# Patient Record
Sex: Male | Born: 1965 | Race: White | Hispanic: No | Marital: Married
Health system: Southern US, Community
[De-identification: ages and names within clinical notes are randomized; demographics above are authoritative.]

## PROBLEM LIST (undated history)

## (undated) DIAGNOSIS — I10 Essential (primary) hypertension: Secondary | ICD-10-CM

---

## 2018-03-21 ENCOUNTER — Encounter (HOSPITAL_COMMUNITY): Payer: Self-pay | Admitting: Pharmacy Technician

## 2018-03-21 ENCOUNTER — Other Ambulatory Visit: Payer: Self-pay

## 2018-03-21 ENCOUNTER — Emergency Department (HOSPITAL_COMMUNITY): Payer: No Typology Code available for payment source

## 2018-03-21 ENCOUNTER — Emergency Department (HOSPITAL_COMMUNITY)
Admission: EM | Admit: 2018-03-21 | Discharge: 2018-03-21 | Disposition: A | Discharge status: Home / Self Care | Payer: No Typology Code available for payment source | Attending: Emergency Medicine | Admitting: Emergency Medicine

## 2018-03-21 DIAGNOSIS — I10 Essential (primary) hypertension: Secondary | ICD-10-CM | POA: Insufficient documentation

## 2018-03-21 DIAGNOSIS — R072 Precordial pain: Secondary | ICD-10-CM | POA: Insufficient documentation

## 2018-03-21 DIAGNOSIS — R079 Chest pain, unspecified: Secondary | ICD-10-CM | POA: Diagnosis present

## 2018-03-21 HISTORY — DX: Essential (primary) hypertension: I10

## 2018-03-21 LAB — CBC WITH DIFFERENTIAL/PLATELET
ABS IMMATURE GRANULOCYTES: 0.03 10*3/uL (ref 0.00–0.07)
Basophils Absolute: 0.1 10*3/uL (ref 0.0–0.1)
Basophils Relative: 1 %
EOS ABS: 0 10*3/uL (ref 0.0–0.5)
Eosinophils Relative: 1 %
HCT: 46.8 % (ref 39.0–52.0)
Hemoglobin: 15.1 g/dL (ref 13.0–17.0)
IMMATURE GRANULOCYTES: 1 %
Lymphocytes Relative: 35 %
Lymphs Abs: 1.7 10*3/uL (ref 0.7–4.0)
MCH: 27.8 pg (ref 26.0–34.0)
MCHC: 32.3 g/dL (ref 30.0–36.0)
MCV: 86 fL (ref 80.0–100.0)
MONOS PCT: 10 %
Monocytes Absolute: 0.5 10*3/uL (ref 0.1–1.0)
NEUTROS ABS: 2.7 10*3/uL (ref 1.7–7.7)
NEUTROS PCT: 52 %
NRBC: 0 % (ref 0.0–0.2)
PLATELETS: 255 10*3/uL (ref 150–400)
RBC: 5.44 MIL/uL (ref 4.22–5.81)
RDW: 13.7 % (ref 11.5–15.5)
WBC: 5 10*3/uL (ref 4.0–10.5)

## 2018-03-21 LAB — COMPREHENSIVE METABOLIC PANEL
ALT: 57 U/L — ABNORMAL HIGH (ref 0–44)
ANION GAP: 12 (ref 5–15)
AST: 42 U/L — ABNORMAL HIGH (ref 15–41)
Albumin: 4.8 g/dL (ref 3.5–5.0)
Alkaline Phosphatase: 45 U/L (ref 38–126)
BILIRUBIN TOTAL: 0.6 mg/dL (ref 0.3–1.2)
BUN: 9 mg/dL (ref 6–20)
CHLORIDE: 99 mmol/L (ref 98–111)
CO2: 25 mmol/L (ref 22–32)
Calcium: 10.6 mg/dL — ABNORMAL HIGH (ref 8.9–10.3)
Creatinine, Ser: 0.88 mg/dL (ref 0.61–1.24)
Glucose, Bld: 105 mg/dL — ABNORMAL HIGH (ref 70–99)
POTASSIUM: 3.7 mmol/L (ref 3.5–5.1)
Sodium: 136 mmol/L (ref 135–145)
TOTAL PROTEIN: 7.8 g/dL (ref 6.5–8.1)

## 2018-03-21 LAB — I-STAT TROPONIN, ED
TROPONIN I, POC: 0.01 ng/mL (ref 0.00–0.08)
TROPONIN I, POC: 0 ng/mL (ref 0.00–0.08)

## 2018-03-21 LAB — LIPASE, BLOOD: LIPASE: 41 U/L (ref 11–51)

## 2018-03-21 MED ORDER — ASPIRIN 81 MG PO CHEW: 81.0000 mg | CHEWABLE_TABLET | Freq: Every day | ORAL | 0 refills | Status: AC

## 2018-03-21 MED ORDER — OMEPRAZOLE 20 MG PO CPDR: 20.0000 mg | DELAYED_RELEASE_CAPSULE | Freq: Every day | ORAL | 0 refills | Status: AC

## 2018-03-21 MED ORDER — ASPIRIN 81 MG PO CHEW
324.0000 mg | CHEWABLE_TABLET | Freq: Once | ORAL | Status: AC
  Administered 2018-03-21: 324 mg via ORAL
  Filled 2018-03-21: qty 4

## 2018-03-21 NOTE — ED Triage Notes (Signed)
Pt arrives pov with reports of CP onset 0800 today. Hx indigestion. Pt in NAD.

## 2018-03-21 NOTE — ED Provider Notes (Signed)
[facility] EMERGENCY DEPARTMENT Provider Note   CSN: [id number] Arrival date & time: 03/21/18  1216     History   Chief Complaint Chief Complaint  Patient presents with  . Chest Pain    HPI Reginald Floyd is a 52 y.o. male.  HPI Patient reports that this morning he was at work and he was just doing light activity.  No physical exertion.  He reports he started to get a pain in the left side of his chest.  He reports that kind of moved up towards the top of his chest and towards the bottom of his ribs.  It was aching in quality.  He reports that he did not have any associated shortness of breath, nausea, diaphoresis.  He reports he thought it was indigestion but it did kind of persist.  He went on to take a couple of Tums.  Ultimately the pain did abate and resolved.  After having had that episode, he moved furniture.  He reports that during the exertion of moving furniture, he did not have any recurrence of pain, shortness of breath or any other symptoms.  Patient has no cardiac history.  He is a non-smoker.  Family history negative for early coronary artery disease.  He has multiple siblings without early CAD.  Father developed an MI in his 54s. Past Medical History:  Diagnosis Date  . Hypertension     There are no active problems to display for this patient.   History reviewed. No pertinent surgical history.      Home Medications    Prior to Admission medications   Medication Sig Start Date End Date Taking? Authorizing Provider  aspirin 81 MG chewable tablet Chew 1 tablet (81 mg total) by mouth daily. 03/21/18   Arby Barrette, MD  omeprazole (PRILOSEC) 20 MG capsule Take 1 capsule (20 mg total) by mouth daily. 03/21/18   Arby Barrette, MD    Family History No family history on file.  Social History Social History   Tobacco Use  . Smoking status: Not on file  Substance Use Topics  . Alcohol use: Not on file  . Drug use: Not on file      Allergies   Patient has no allergy information on record.   Review of Systems Review of Systems 10 Systems reviewed and are negative for acute change except as noted in the HPI.   Physical Exam Updated Vital Signs BP (!) 172/91   Pulse 77   Temp 98.6 F (37 C) (Oral)   Resp 12   SpO2 98%   Physical Exam  Constitutional: He is oriented to person, place, and time. He appears well-developed and well-nourished. No distress.  HENT:  Head: Normocephalic and atraumatic.  Eyes: Pupils are equal, round, and reactive to light. EOM are normal.  Neck: Neck supple.  Cardiovascular: Normal rate, regular rhythm, normal heart sounds and intact distal pulses.  Pulmonary/Chest: Effort normal and breath sounds normal.  Abdominal: Soft. Bowel sounds are normal. He exhibits no distension. There is no tenderness.  Musculoskeletal: Normal range of motion. He exhibits no edema or tenderness.  Neurological: He is alert and oriented to person, place, and time. He has normal strength. Coordination normal. GCS eye subscore is 4. GCS verbal subscore is 5. GCS motor subscore is 6.  Skin: Skin is warm, dry and intact.  Psychiatric: He has a normal mood and affect.     ED Treatments / Results  Labs (all labs ordered are listed, but  only abnormal results are displayed) Labs Reviewed  COMPREHENSIVE METABOLIC PANEL - Abnormal; Notable for the following components:      Result Value   Glucose, Bld 105 (*)    Calcium 10.6 (*)    AST 42 (*)    ALT 57 (*)    All other components within normal limits  LIPASE, BLOOD  CBC WITH DIFFERENTIAL/PLATELET  I-STAT TROPONIN, ED  I-STAT TROPONIN, ED    EKG EKG Interpretation  Date/Time:  Friday March 21 2018 12:23:55 EST Ventricular Rate:  72 PR Interval:    QRS Duration: 97 QT Interval:  392 QTC Calculation: 429 R Axis:   59 Text Interpretation:  Sinus rhythm no acute ischemic appearance. no old comparison Confirmed by Arby BarrettePfeiffer, Oswin Johal [phone]([id number]) on  03/21/2018 12:30:37 PM   Radiology Dg Chest 2 View  Result Date: 03/21/2018 CLINICAL DATA:  Chest pain EXAM: CHEST - 2 VIEW COMPARISON:  None. FINDINGS: The heart size and mediastinal contours are within normal limits. Both lungs are clear. The visualized skeletal structures are unremarkable. IMPRESSION: No active cardiopulmonary disease. Electronically Signed   By: Alcide CleverMark  Lukens M.D.   On: 03/21/2018 13:40    Procedures Procedures (including critical care time)  Medications Ordered in ED Medications  aspirin chewable tablet 324 mg (324 mg Oral Given 03/21/18 1248)     Initial Impression / Assessment and Plan / ED Course  I have reviewed the triage vital signs and the nursing notes.  Pertinent labs & imaging results that were available during my care of the patient were reviewed by me and considered in my medical decision making (see chart for details).    Patient clinically well in appearance.  He has very low risk factors for coronary artery disease.  He had an episode of chest pain without associated symptoms at about 8 AM.  This resolved after taking some antacids.  He then went on to do physical exertional activity and did not develop any symptoms.  EKG is normal and 2 sets of cardiac enzymes are negative.  Patient is counseled on follow-up with his PCP to schedule\evaluate for outpatient stress testing.  He is counseled to take a daily baby aspirin and a daily Prilosec.  He is counseled to return immediately should he have recurrence of chest pain or other ischemic type symptoms listed in discharge instructions.  Final Clinical Impressions(s) / ED Diagnoses   Final diagnoses:  Precordial pain    ED Discharge Orders         Ordered    aspirin 81 MG chewable tablet  Daily     03/21/18 1551    omeprazole (PRILOSEC) 20 MG capsule  Daily     03/21/18 1551           Arby BarrettePfeiffer, Gilliam Hawkes, MD 03/21/18 1606

## 2018-03-21 NOTE — ED Notes (Signed)
Pt states he took medication for indigestion which improved pain from a 9 to a 3/10.

## 2018-03-21 NOTE — Discharge Instructions (Addendum)
1.  Take a daily baby aspirin and a daily Prilosec as prescribed. 2.  Set an appointment with your doctor next week to discuss scheduling a follow-up cardiac stress test. 3.  Return to the emergency department if you have any recurrence of chest pain, if you develop nausea, sweatiness, shortness of breath or lightheadedness.

## 2019-04-08 IMAGING — DX DG CHEST 2V
2 series · 2 of 2 positions shown · non-contrast
Comparison: None.

CLINICAL DATA: Chest pain

EXAM:
CHEST - 2 VIEW

[w chest pa]
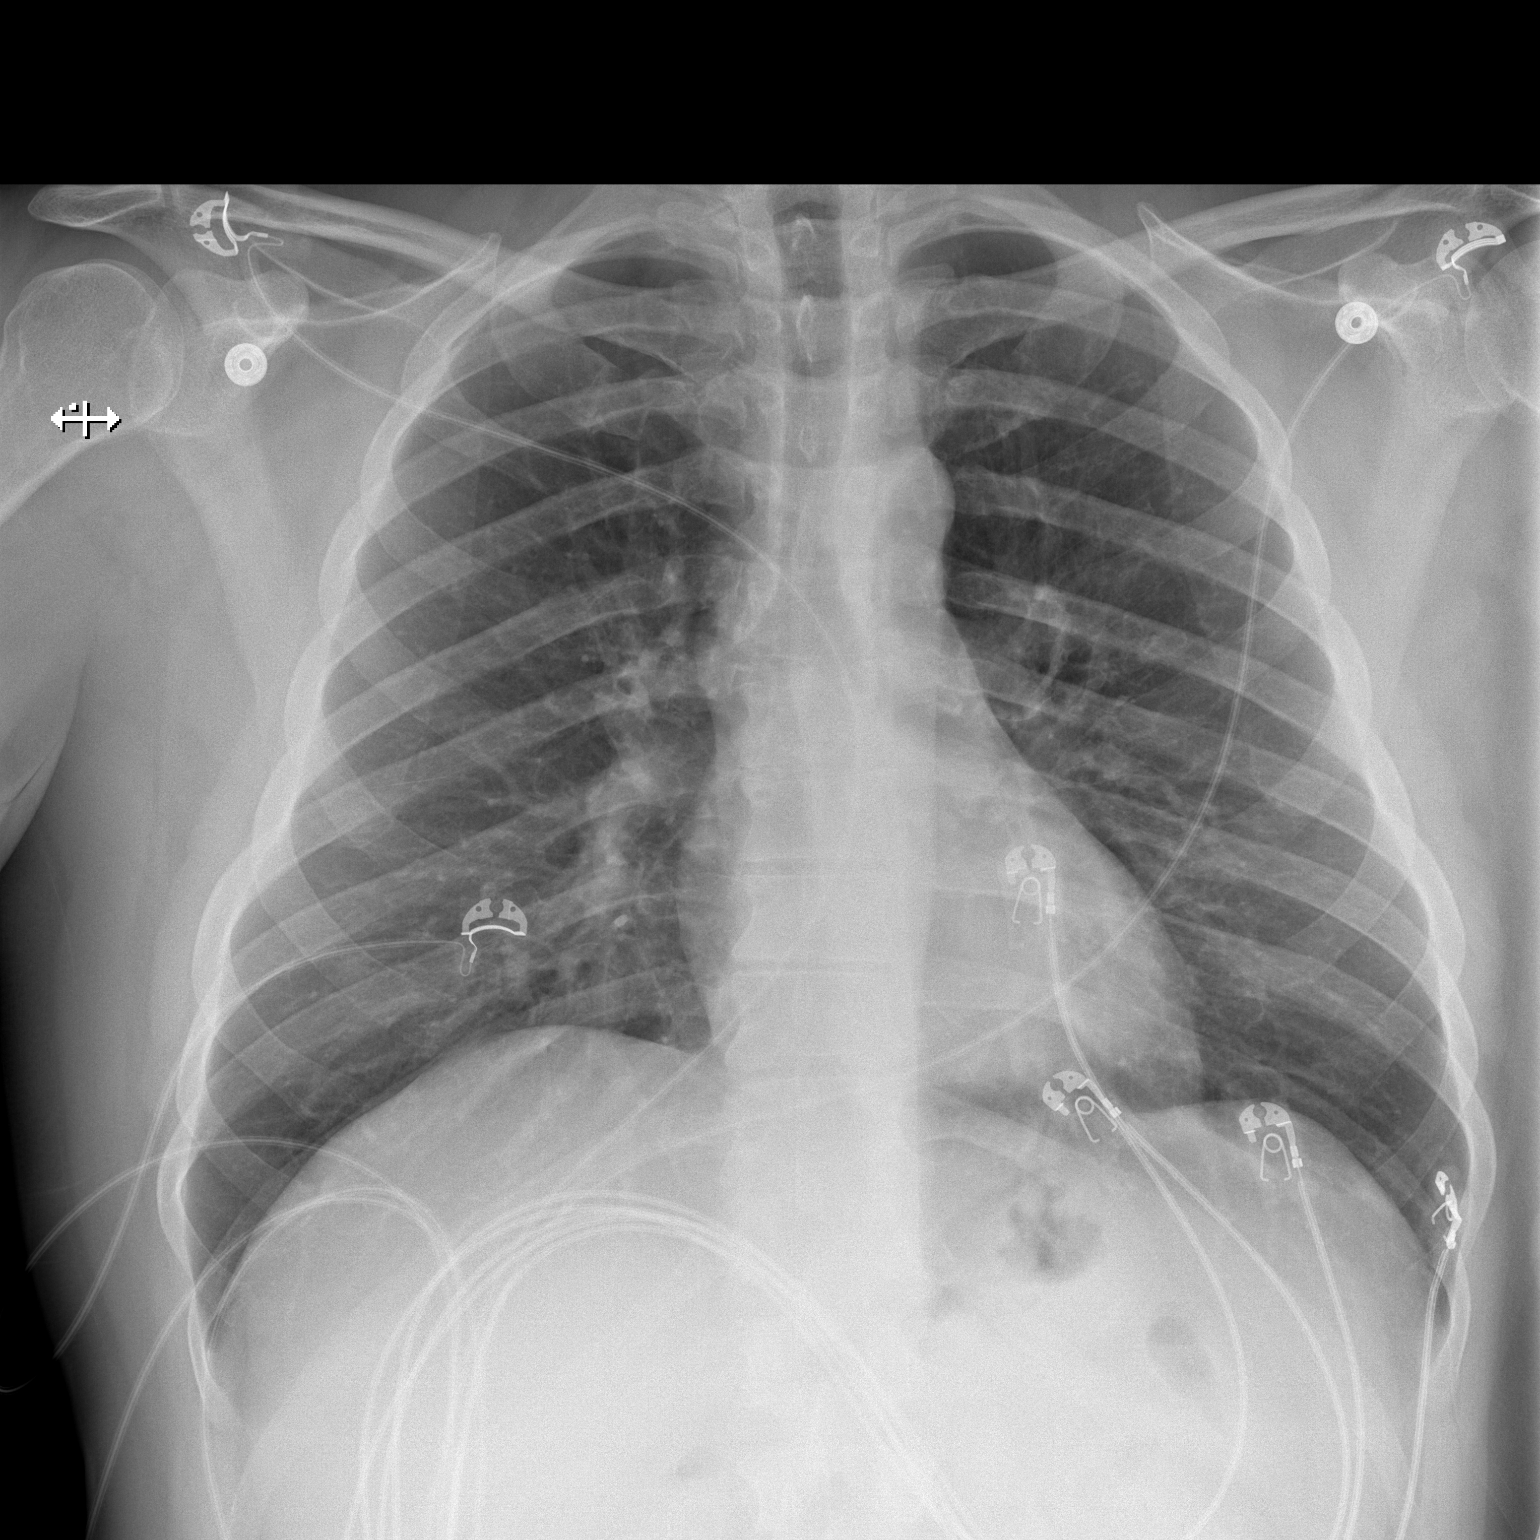

[w chest lat]
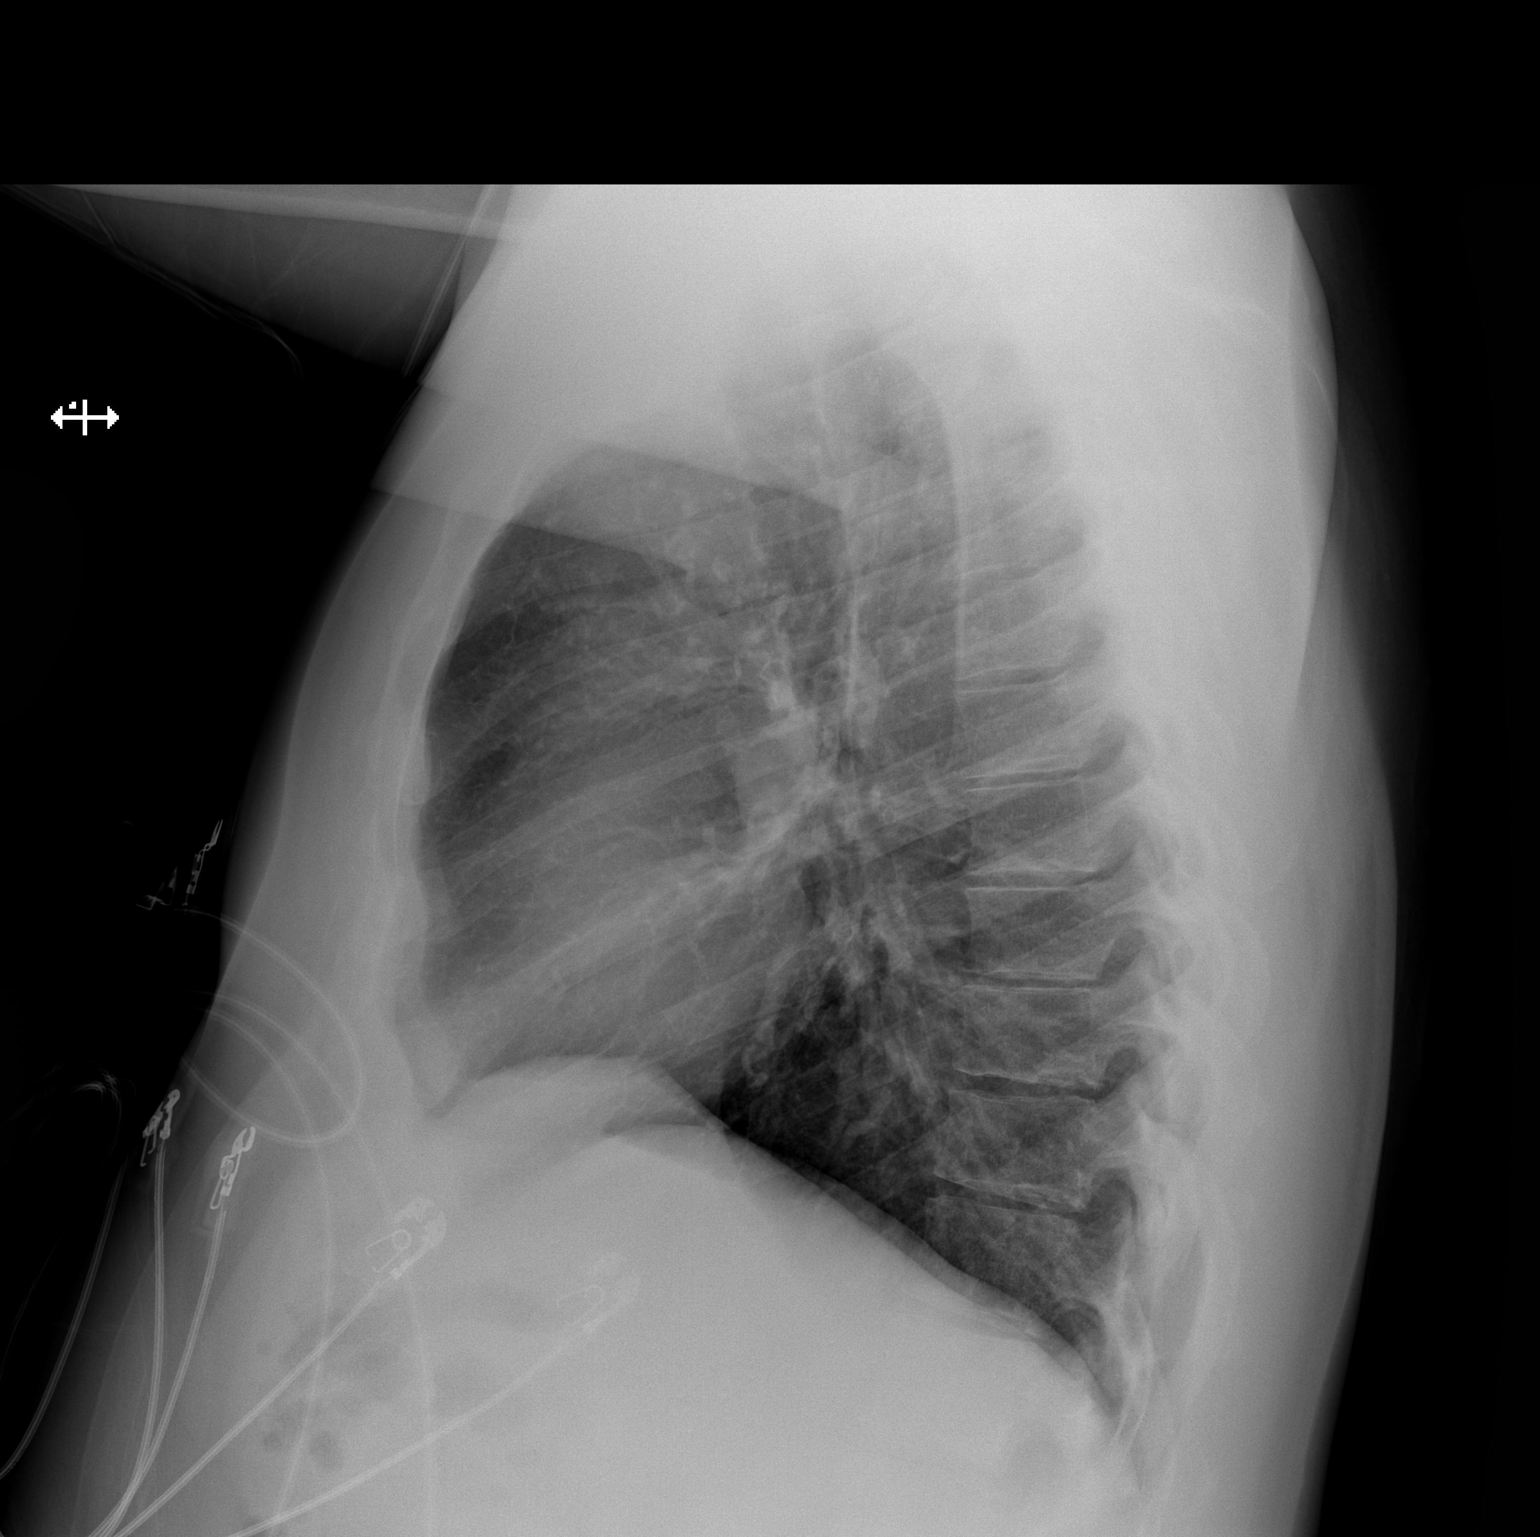

[2 of 2 positions shown; findings below may reference images not displayed]

FINDINGS: The heart size and mediastinal contours are within normal limits.
Both lungs are clear. The visualized skeletal structures are
unremarkable.
IMPRESSION: No active cardiopulmonary disease.

## 2023-04-26 DIAGNOSIS — E7800 Pure hypercholesterolemia, unspecified: Secondary | ICD-10-CM | POA: Diagnosis not present

## 2023-04-26 DIAGNOSIS — R972 Elevated prostate specific antigen [PSA]: Secondary | ICD-10-CM | POA: Diagnosis not present

## 2023-04-26 DIAGNOSIS — I10 Essential (primary) hypertension: Secondary | ICD-10-CM | POA: Diagnosis not present

## 2023-04-26 DIAGNOSIS — Z23 Encounter for immunization: Secondary | ICD-10-CM | POA: Diagnosis not present

## 2023-04-26 DIAGNOSIS — Z0000 Encounter for general adult medical examination without abnormal findings: Secondary | ICD-10-CM | POA: Diagnosis not present

## 2023-04-26 DIAGNOSIS — Z125 Encounter for screening for malignant neoplasm of prostate: Secondary | ICD-10-CM | POA: Diagnosis not present

## 2023-11-19 DIAGNOSIS — R030 Elevated blood-pressure reading, without diagnosis of hypertension: Secondary | ICD-10-CM | POA: Diagnosis not present

## 2023-11-19 DIAGNOSIS — R5383 Other fatigue: Secondary | ICD-10-CM | POA: Diagnosis not present
